# Patient Record
Sex: Male | Born: 1995 | Race: White | Hispanic: No | Marital: Single | Smoking: Former smoker
Health system: Southern US, Community
[De-identification: ages and names within clinical notes are randomized; demographics above are authoritative.]

## PROBLEM LIST (undated history)

## (undated) DIAGNOSIS — B009 Herpesviral infection, unspecified: Secondary | ICD-10-CM

---
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
## 2018-03-28 ENCOUNTER — Other Ambulatory Visit: Payer: Self-pay

## 2018-03-28 ENCOUNTER — Emergency Department (HOSPITAL_COMMUNITY)
Admission: EM | Admit: 2018-03-28 | Discharge: 2018-03-28 | Disposition: A | Discharge status: Home / Self Care | Payer: No Typology Code available for payment source | Attending: Emergency Medicine | Admitting: Emergency Medicine

## 2018-03-28 ENCOUNTER — Encounter (HOSPITAL_COMMUNITY): Payer: Self-pay | Admitting: Emergency Medicine

## 2018-03-28 DIAGNOSIS — R748 Abnormal levels of other serum enzymes: Secondary | ICD-10-CM

## 2018-03-28 DIAGNOSIS — R197 Diarrhea, unspecified: Secondary | ICD-10-CM | POA: Diagnosis not present

## 2018-03-28 DIAGNOSIS — R112 Nausea with vomiting, unspecified: Secondary | ICD-10-CM

## 2018-03-28 DIAGNOSIS — R739 Hyperglycemia, unspecified: Secondary | ICD-10-CM

## 2018-03-28 DIAGNOSIS — R945 Abnormal results of liver function studies: Secondary | ICD-10-CM | POA: Insufficient documentation

## 2018-03-28 DIAGNOSIS — D72829 Elevated white blood cell count, unspecified: Secondary | ICD-10-CM | POA: Diagnosis not present

## 2018-03-28 LAB — URINALYSIS, ROUTINE W REFLEX MICROSCOPIC
Bilirubin Urine: NEGATIVE
Glucose, UA: NEGATIVE mg/dL
Hgb urine dipstick: NEGATIVE
Ketones, ur: NEGATIVE mg/dL
Leukocytes, UA: NEGATIVE
Nitrite: NEGATIVE
Protein, ur: NEGATIVE mg/dL
Specific Gravity, Urine: 1.021 (ref 1.005–1.030)
pH: 5 (ref 5.0–8.0)

## 2018-03-28 LAB — INFLUENZA PANEL BY PCR (TYPE A & B)
Influenza A By PCR: NEGATIVE
Influenza B By PCR: NEGATIVE

## 2018-03-28 LAB — CBC
HCT: 47.9 % (ref 39.0–52.0)
Hemoglobin: 15.8 g/dL (ref 13.0–17.0)
MCH: 28.7 pg (ref 26.0–34.0)
MCHC: 33 g/dL (ref 30.0–36.0)
MCV: 86.9 fL (ref 80.0–100.0)
Platelets: 434 10*3/uL — ABNORMAL HIGH (ref 150–400)
RBC: 5.51 MIL/uL (ref 4.22–5.81)
RDW: 11.9 % (ref 11.5–15.5)
WBC: 19.8 10*3/uL — ABNORMAL HIGH (ref 4.0–10.5)
nRBC: 0 % (ref 0.0–0.2)

## 2018-03-28 LAB — COMPREHENSIVE METABOLIC PANEL
ALT: 98 U/L — ABNORMAL HIGH (ref 0–44)
AST: 57 U/L — ABNORMAL HIGH (ref 15–41)
Albumin: 4.7 g/dL (ref 3.5–5.0)
Alkaline Phosphatase: 74 U/L (ref 38–126)
Anion gap: 13 (ref 5–15)
BUN: 15 mg/dL (ref 6–20)
CO2: 22 mmol/L (ref 22–32)
Calcium: 10.4 mg/dL — ABNORMAL HIGH (ref 8.9–10.3)
Chloride: 104 mmol/L (ref 98–111)
Creatinine, Ser: 1.1 mg/dL (ref 0.61–1.24)
GFR calc Af Amer: >60 mL/min (ref >60)
GFR calc non Af Amer: >60 mL/min (ref >60)
Glucose, Bld: 154 mg/dL — ABNORMAL HIGH (ref 70–99)
Potassium: 3.7 mmol/L (ref 3.5–5.1)
Sodium: 139 mmol/L (ref 135–145)
Total Bilirubin: 0.6 mg/dL (ref 0.3–1.2)
Total Protein: 8.7 g/dL — ABNORMAL HIGH (ref 6.5–8.1)

## 2018-03-28 LAB — LIPASE, BLOOD: Lipase: 25 U/L (ref 11–51)

## 2018-03-28 MED ORDER — SODIUM CHLORIDE 0.9 % IV BOLUS
1000.0000 mL | Freq: Once | INTRAVENOUS | Status: AC
  Administered 2018-03-28: 1000 mL via INTRAVENOUS

## 2018-03-28 MED ORDER — ONDANSETRON 4 MG PO TBDP
4.0000 mg | ORAL_TABLET | Freq: Once | ORAL | Status: AC | PRN
  Administered 2018-03-28: 4 mg via ORAL
  Filled 2018-03-28: qty 1

## 2018-03-28 MED ORDER — PROMETHAZINE HCL 25 MG PO TABS: 25.0000 mg | ORAL_TABLET | Freq: Four times a day (QID) | ORAL | 0 refills | Status: DC | PRN

## 2018-03-28 MED ORDER — HALOPERIDOL LACTATE 5 MG/ML IJ SOLN
2.0000 mg | Freq: Once | INTRAMUSCULAR | Status: AC
  Administered 2018-03-28: 2 mg via INTRAVENOUS
  Filled 2018-03-28: qty 1

## 2018-03-28 MED ORDER — DICYCLOMINE HCL 10 MG PO CAPS
20.0000 mg | ORAL_CAPSULE | Freq: Once | ORAL | Status: AC
  Administered 2018-03-28: 20 mg via ORAL
  Filled 2018-03-28: qty 2

## 2018-03-28 MED ORDER — SODIUM CHLORIDE 0.9% FLUSH: 3.0000 mL | Freq: Once | INTRAVENOUS | Status: DC

## 2018-03-28 MED ORDER — METOCLOPRAMIDE HCL 5 MG/ML IJ SOLN
10.0000 mg | Freq: Once | INTRAMUSCULAR | Status: AC
  Administered 2018-03-28: 10 mg via INTRAVENOUS
  Filled 2018-03-28: qty 2

## 2018-03-28 MED ORDER — DICYCLOMINE HCL 20 MG PO TABS: 20.0000 mg | ORAL_TABLET | Freq: Two times a day (BID) | ORAL | 0 refills | Status: DC

## 2018-03-28 NOTE — ED Notes (Signed)
  Patient has tolerated water and ginger ale.  Patient states he feels better and has had no nausea or vomiting since the haldol.

## 2018-03-28 NOTE — Discharge Instructions (Addendum)
The nausea tablet I have prescribed (phenergan) can be taken both by mouth, or placed in your rectum as a suppository if you are unable to hold the medication down by mouth. Continue to take frequent small sips of fluid every 10 minutes. You may use over the counter medications for diarrhea such as kaopectate, Pepto bismol, or imodium.   Contact a health care provider if: You cannot keep fluids down. Your symptoms get worse. You have new symptoms. You feel light-headed or dizzy. You have muscle cramps. Get help right away if: You have chest pain. You feel extremely weak or you faint. You see blood in your vomit. Your vomit looks like coffee grounds. You have bloody or black stools or stools that look like tar. You have a severe headache, a stiff neck, or both. You have a rash. You have severe pain, cramping, or bloating in your abdomen. You have trouble breathing or you are breathing very quickly. Your heart is beating very quickly. Your skin feels cold and clammy. You feel confused. You have pain when you urinate. You have signs of dehydration, such as: Dark urine, very little urine, or no urine. Cracked lips. Dry mouth. Sunken eyes. Sleepiness. Weakness.

## 2018-03-28 NOTE — ED Triage Notes (Signed)
C/o sharp generalized abd pain, nausea, vomiting, and diarrhea x 3-4 hours.

## 2018-03-28 NOTE — ED Provider Notes (Signed)
[facility] Provider Note   CSN: [id number] Arrival date & time: 03/28/18  0023     History   Chief Complaint Chief Complaint  Patient presents with  . Abdominal Pain  . Vomiting  . Diarrhea    HPI Spencer Koch is a 23 y.o. male who presents with cc of N/V/D. Hx is given by the patient and his roommates who are at bedside. The patient had sudden onset of severe intractable vomiting and diarrhea.  The patient has had greater than 15 episodes of nonbloody nonbilious vomitus and at least 10 episodes of brown watery diarrhea.  He complains of generalized, crampy abdominal pain without focal tenderness.  He denies fever or chills.  No one in the house has had the same symptoms.  He denies any recent foreign travel.  He is unsure if he has ingested any suspicious foods.  HPI  History reviewed. No pertinent past medical history.  There are no active problems to display for this patient.   History reviewed. No pertinent surgical history.      Home Medications    Prior to Admission medications   Not on File    Family History No family history on file.  Social History Social History   Tobacco Use  . Smoking status: Never Smoker  . Smokeless tobacco: Never Used  Substance Use Topics  . Alcohol use: Not Currently  . Drug use: Not Currently     Allergies   Patient has no allergy information on record.   Review of Systems Review of Systems Ten systems reviewed and are negative for acute change, except as noted in the HPI.   Physical Exam Updated Vital Signs BP (!) 115/56   Pulse 83   Temp 97.8 F (36.6 C) (Oral)   Resp 18   Ht 5\' 6"  (1.676 m)   Wt 81.6 kg   SpO2 98%   BMI 29.05 kg/m   Physical Exam Vitals signs and nursing note reviewed.  Constitutional:      General: He is not in acute distress.    Appearance: He is well-developed. He is ill-appearing. He is not diaphoretic.     Comments: Patient actively  vomiting and retching  HENT:     Head: Normocephalic and atraumatic.     Comments: Face and upper chest are covered in petechial hemorrhage    Mouth/Throat:     Mouth: Mucous membranes are dry.  Eyes:     General: No scleral icterus.    Conjunctiva/sclera: Conjunctivae normal.  Neck:     Musculoskeletal: Normal range of motion and neck supple.  Cardiovascular:     Rate and Rhythm: Normal rate and regular rhythm.     Heart sounds: Normal heart sounds.  Pulmonary:     Effort: Pulmonary effort is normal. No respiratory distress.     Breath sounds: Normal breath sounds.  Abdominal:     Palpations: Abdomen is soft.     Tenderness: There is no abdominal tenderness.  Skin:    General: Skin is warm and dry.  Neurological:     Mental Status: He is alert.  Psychiatric:        Behavior: Behavior normal.      ED Treatments / Results  Labs (all labs ordered are listed, but only abnormal results are displayed) Labs Reviewed  COMPREHENSIVE METABOLIC PANEL - Abnormal; Notable for the following components:      Result Value   Glucose, Bld 154 (*)  Calcium 10.4 (*)    Total Protein 8.7 (*)    AST 57 (*)    ALT 98 (*)    All other components within normal limits  CBC - Abnormal; Notable for the following components:   WBC 19.8 (*)    Platelets 434 (*)    All other components within normal limits  LIPASE, BLOOD  INFLUENZA PANEL BY PCR (TYPE A & B)  URINALYSIS, ROUTINE W REFLEX MICROSCOPIC    EKG None  Radiology No results found.  Procedures Procedures (including critical care time)  Medications Ordered in ED Medications  sodium chloride flush (NS) 0.9 % injection 3 mL (has no administration in time range)  dicyclomine (BENTYL) capsule 20 mg (has no administration in time range)  haloperidol lactate (HALDOL) injection 2 mg (has no administration in time range)  ondansetron (ZOFRAN-ODT) disintegrating tablet 4 mg (4 mg Oral Given 03/28/18 0049)  sodium chloride 0.9 %  bolus 1,000 mL (0 mLs Intravenous Stopped 03/28/18 0241)  metoCLOPramide (REGLAN) injection 10 mg (10 mg Intravenous Given 03/28/18 0137)     Initial Impression / Assessment and Plan / ED Course  I have reviewed the triage vital signs and the nursing notes.  Pertinent labs & imaging results that were available during my care of the patient were reviewed by me and considered in my medical decision making (see chart for details).  Clinical Course as of Mar 28 346  Thu Mar 28, 2018  0346 Patient vomited his fluids. Reassessment of abdomen remains benign without focal tenderness, rebound or guarding. Patient does admit to daily marijuana use. ECG performed and shows NSR without QT prolongation. Will give Haldol.   [AH]    Clinical Course User Index [AH] Margarita Mail, PA-C     23 year old male with sudden onset nausea vomiting and diarrhea.  Patient labs reviewed and shows leukocytosis and elevated liver enzymes.  His blood glucose level is also elevated which I suspect is all of acute phase reaction.  Patient vomiting resolved after administration of Haldol.  He has been able to tolerate p.o. fluids.  He has a benign abdominal exam and I have no suspicion for intra-abdominal infection.  Patient has had stable vital signs.  Tolerating p.o. fluids and appears appropriate for discharge at this time. Final Clinical Impressions(s) / ED Diagnoses   Final diagnoses:  Nausea vomiting and diarrhea  Elevated liver enzymes  Leukocytosis, unspecified type  Hyperglycemia    ED Discharge Orders    None       Margarita Mail, PA-C 03/28/18 3662    Fatima Blank, MD 03/28/18 [phone]

## 2018-09-23 ENCOUNTER — Ambulatory Visit: Payer: No Typology Code available for payment source | Admitting: Family Medicine

## 2018-09-23 DIAGNOSIS — Z0289 Encounter for other administrative examinations: Secondary | ICD-10-CM

## 2018-10-28 ENCOUNTER — Other Ambulatory Visit: Payer: Self-pay

## 2018-10-28 DIAGNOSIS — Z20822 Contact with and (suspected) exposure to covid-19: Secondary | ICD-10-CM

## 2018-10-30 LAB — NOVEL CORONAVIRUS, NAA: SARS-CoV-2, NAA: NOT DETECTED

## 2018-12-10 ENCOUNTER — Other Ambulatory Visit: Payer: Self-pay

## 2018-12-10 DIAGNOSIS — Z20822 Contact with and (suspected) exposure to covid-19: Secondary | ICD-10-CM

## 2018-12-11 LAB — NOVEL CORONAVIRUS, NAA: SARS-CoV-2, NAA: NOT DETECTED

## 2019-03-17 ENCOUNTER — Encounter: Payer: Self-pay | Admitting: Family Medicine

## 2019-03-17 ENCOUNTER — Other Ambulatory Visit (HOSPITAL_COMMUNITY)
Admission: RE | Admit: 2019-03-17 | Discharge: 2019-03-17 | Disposition: A | Discharge status: Home / Self Care | Payer: No Typology Code available for payment source | Source: Ambulatory Visit | Attending: Family Medicine | Admitting: Family Medicine

## 2019-03-17 ENCOUNTER — Other Ambulatory Visit: Payer: Self-pay

## 2019-03-17 ENCOUNTER — Ambulatory Visit (INDEPENDENT_AMBULATORY_CARE_PROVIDER_SITE_OTHER): Payer: No Typology Code available for payment source | Admitting: Family Medicine

## 2019-03-17 VITALS — BP 116/60 | HR 68 | Temp 98.7°F | Ht 66.0 in | Wt 185.2 lb

## 2019-03-17 DIAGNOSIS — R300 Dysuria: Secondary | ICD-10-CM | POA: Insufficient documentation

## 2019-03-17 DIAGNOSIS — H6123 Impacted cerumen, bilateral: Secondary | ICD-10-CM | POA: Insufficient documentation

## 2019-03-17 LAB — URINALYSIS, ROUTINE W REFLEX MICROSCOPIC
Bilirubin Urine: NEGATIVE
Hgb urine dipstick: NEGATIVE
Ketones, ur: NEGATIVE
Leukocytes,Ua: NEGATIVE
Nitrite: NEGATIVE
RBC / HPF: NONE SEEN (ref 0–?)
Specific Gravity, Urine: 1.02 (ref 1.000–1.030)
Total Protein, Urine: NEGATIVE
Urine Glucose: NEGATIVE
Urobilinogen, UA: 0.2 (ref 0.0–1.0)
pH: 7.5 (ref 5.0–8.0)

## 2019-03-17 MED ORDER — EAR WAX CLEANSING 6.5 % OT KIT: PACK | OTIC | 0 refills | Status: DC

## 2019-03-17 MED ORDER — DOXYCYCLINE HYCLATE 100 MG PO TABS: 100.0000 mg | ORAL_TABLET | Freq: Two times a day (BID) | ORAL | 0 refills | Status: DC

## 2019-03-17 NOTE — Progress Notes (Signed)
New Patient Office Visit  Subjective:  Patient ID: Spencer Koch, male    DOB: 1996/02/09  Age: 24 y.o. MRN: [id number]  CC:  Chief Complaint  Patient presents with  . Establish Care    personal concerns     HPI Spencer Koch presents for establishment of care and follow-up for abnormal urine screening results.  Patient, himself, had blood work and urine tested through [facility].  Testing included normal CBC, CMP.  Urinalysis was positive for leukocyte esterase and 6-10 WBCs per high-powered field.  Screening for trichomonas and Gardnerella were negative.  He tested negative for type I and type II herpes simplex.  Tested negative for hepatitis A, B and C.  He was not tested for HIV GC or chlamydia.  Last sexual activity was back in October.  He does believe he has had some mild dysuria but denies decrease in the force of his stream nocturia or discharge.  He lives with his roommate.  He works in [employer]  History reviewed. No pertinent past medical history.  History reviewed. No pertinent surgical history.  History reviewed. No pertinent family history.  Social History   Socioeconomic History  . Marital status: Single    Spouse name: Not on file  . Number of children: Not on file  . Years of education: Not on file  . Highest education level: Not on file  Occupational History  . Not on file  Tobacco Use  . Smoking status: Current Every Day Smoker    Packs/day: 0.25    Types: Cigarettes  . Smokeless tobacco: Never Used  Substance and Sexual Activity  . Alcohol use: Yes    Alcohol/week: 5.0 - 9.0 standard drinks    Types: 1 - 5 Cans of beer, 2 Shots of liquor, 2 Standard drinks or equivalent per week  . Drug use: Not Currently    Types: Marijuana    Comment: social  . Sexual activity: Yes  Other Topics Concern  . Not on file  Social History Narrative  . Not on file   Social Determinants of Health   Financial Resource Strain:   .  Difficulty of Paying Living Expenses: Not on file  Food Insecurity:   . Worried About Charity fundraiser in the Last Year: Not on file  . Ran Out of Food in the Last Year: Not on file  Transportation Needs:   . Lack of Transportation (Medical): Not on file  . Lack of Transportation (Non-Medical): Not on file  Physical Activity:   . Days of Exercise per Week: Not on file  . Minutes of Exercise per Session: Not on file  Stress:   . Feeling of Stress : Not on file  Social Connections:   . Frequency of Communication with Friends and Family: Not on file  . Frequency of Social Gatherings with Friends and Family: Not on file  . Attends Religious Services: Not on file  . Active Member of Clubs or Organizations: Not on file  . Attends Archivist Meetings: Not on file  . Marital Status: Not on file  Intimate Partner Violence:   . Fear of Current or Ex-Partner: Not on file  . Emotionally Abused: Not on file  . Physically Abused: Not on file  . Sexually Abused: Not on file    ROS Review of Systems  Constitutional: Negative.   Respiratory: Negative.   Cardiovascular: Negative.   Gastrointestinal: Negative.   Endocrine: Negative for polyphagia and polyuria.  Genitourinary: Positive for dysuria. Negative for difficulty urinating, discharge, frequency, genital sores and urgency.  Musculoskeletal: Negative for arthralgias and myalgias.  Skin: Negative for pallor and rash.  Allergic/Immunologic: Negative for immunocompromised state.  Neurological: Negative for light-headedness and numbness.  Hematological: Does not bruise/bleed easily.  Psychiatric/Behavioral: Negative.     Objective:   Today's Vitals: BP 116/60   Pulse 68   Temp 98.7 F (37.1 C) (Oral)   Ht 5' 6" (1.676 m)   Wt 185 lb 3.2 oz (84 kg)   SpO2 98%   BMI 29.89 kg/m   Physical Exam Vitals and nursing note reviewed.  Constitutional:      General: He is not in acute distress.    Appearance: Normal  appearance. He is normal weight. He is not ill-appearing, toxic-appearing or diaphoretic.  HENT:     Head: Normocephalic and atraumatic.     Right Ear: External ear normal. There is impacted cerumen.     Left Ear: External ear normal. There is impacted cerumen.     Nose: No congestion or rhinorrhea.     Mouth/Throat:     Mouth: Mucous membranes are moist.     Pharynx: Oropharynx is clear. No oropharyngeal exudate or posterior oropharyngeal erythema.  Eyes:     General: No scleral icterus.       Right eye: No discharge.        Left eye: No discharge.     Extraocular Movements: Extraocular movements intact.     Conjunctiva/sclera: Conjunctivae normal.     Pupils: Pupils are equal, round, and reactive to light.  Neck:     Vascular: No carotid bruit.  Cardiovascular:     Rate and Rhythm: Normal rate and regular rhythm.  Pulmonary:     Effort: Pulmonary effort is normal.     Breath sounds: Normal breath sounds.  Abdominal:     General: Bowel sounds are normal.     Hernia: There is no hernia in the left inguinal area or right inguinal area.  Genitourinary:    Penis: Circumcised. No hypospadias, erythema, tenderness, discharge, swelling or lesions.      Testes: Normal.        Right: Mass, tenderness or swelling not present. Right testis is descended.        Left: Mass or swelling not present. Left testis is descended.     Epididymis:     Right: Not inflamed or enlarged. No mass.     Left: Not inflamed. No mass.  Musculoskeletal:     Cervical back: No rigidity or tenderness.  Lymphadenopathy:     Cervical: No cervical adenopathy.     Lower Body: No right inguinal adenopathy. No left inguinal adenopathy.  Neurological:     Mental Status: He is alert and oriented to person, place, and time.  Psychiatric:        Mood and Affect: Mood normal.        Behavior: Behavior normal.     Assessment & Plan:   Problem List Items Addressed This Visit      Nervous and Auditory    Ceruminosis, bilateral   Relevant Medications   Carbamide Peroxide-Saline (EAR WAX CLEANSING) 6.5 % KIT     Other   Dysuria - Primary   Relevant Medications   doxycycline (VIBRA-TABS) 100 MG tablet   Other Relevant Orders   HIV antibody (with reflex)   Urine cytology ancillary only([place])   Urinalysis, Routine w reflex microscopic   Urine Culture  Outpatient Encounter Medications as of 03/17/2019  Medication Sig  . Carbamide Peroxide-Saline (EAR WAX CLEANSING) 6.5 % KIT Follow directions on kit.  Marland Kitchen dicyclomine (BENTYL) 20 MG tablet Take 1 tablet (20 mg total) by mouth 2 (two) times daily. (Patient not taking: Reported on 03/17/2019)  . doxycycline (VIBRA-TABS) 100 MG tablet Take 1 tablet (100 mg total) by mouth 2 (two) times daily.  . promethazine (PHENERGAN) 25 MG tablet Take 1 tablet (25 mg total) by mouth every 6 (six) hours as needed for nausea or vomiting. (Patient not taking: Reported on 03/17/2019)   No facility-administered encounter medications on file as of 03/17/2019.    Follow-up: Return in about 3 months (around 06/15/2019), or if symptoms worsen or fail to improve, for Return for physical. .   Patient was given information on doxycycline and dysuria.  Libby Maw, MD

## 2019-03-17 NOTE — Patient Instructions (Signed)
Dysuria Dysuria is pain or discomfort while urinating. The pain or discomfort may be felt in the part of your body that drains urine from the bladder (urethra) or in the surrounding tissue of the genitals. The pain may also be felt in the groin area, lower abdomen, or lower back. You may have to urinate frequently or have the sudden feeling that you have to urinate (urgency). Dysuria can affect both men and women, but it is more common in women. Dysuria can be caused by many different things, including:  Urinary tract infection.  Kidney stones or bladder stones.  Certain sexually transmitted infections (STIs), such as chlamydia.  Dehydration.  Inflammation of the tissues of the vagina.  Use of certain medicines.  Use of certain soaps or scented products that cause irritation. Follow these instructions at home: General instructions  Watch your condition for any changes.  Urinate often. Avoid holding urine for long periods of time.  After a bowel movement or urination, women should cleanse from front to back, using each tissue only once.  Urinate after sexual intercourse.  Keep all follow-up visits as told by your health care provider. This is important.  If you had any tests done to find the cause of dysuria, it is up to you to get your test results. Ask your health care provider, or the department that is doing the test, when your results will be ready. Eating and drinking   Drink enough fluid to keep your urine pale yellow.  Avoid caffeine, tea, and alcohol. They can irritate the bladder and make dysuria worse. In men, alcohol may irritate the prostate. Medicines  Take over-the-counter and prescription medicines only as told by your health care provider.  If you were prescribed an antibiotic medicine, take it as told by your health care provider. Do not stop taking the antibiotic even if you start to feel better. Contact a health care provider if:  You have a  fever.  You develop pain in your back or sides.  You have nausea or vomiting.  You have blood in your urine.  You are not urinating as often as you usually do. Get help right away if:  Your pain is severe and not relieved with medicines.  You cannot eat or drink without vomiting.  You are confused.  You have a rapid heartbeat while at rest.  You have shaking or chills.  You feel extremely weak. Summary  Dysuria is pain or discomfort while urinating. Many different conditions can lead to dysuria.  If you have dysuria, you may have to urinate frequently or have the sudden feeling that you have to urinate (urgency).  Watch your condition for any changes. Keep all follow-up visits as told by your health care provider.  Make sure that you urinate often and drink enough fluid to keep your urine pale yellow. This information is not intended to replace advice given to you by your health care provider. Make sure you discuss any questions you have with your health care provider. Document Revised: 02/09/2017 Document Reviewed: 12/14/2016 Elsevier Patient Education  [place]. Doxycycline tablets or capsules What is this medicine? DOXYCYCLINE (dox i SYE kleen) is a tetracycline antibiotic. It kills certain bacteria or stops their growth. It is used to treat many kinds of infections, like dental, skin, respiratory, and urinary tract infections. It also treats acne, Lyme disease, malaria, and certain sexually transmitted infections. This medicine may be used for other purposes; ask your health care provider or pharmacist if  you have questions. COMMON BRAND NAME(S): Acticlate, Adoxa, Adoxa CK, Adoxa Pak, Adoxa TT, Alodox, Avidoxy, Doxal, LYMEPAK, Mondoxyne NL, Monodox, Morgidox 1x, Morgidox 1x Kit, Morgidox 2x, Morgidox 2x Kit, NutriDox, Ocudox, [place], [place], Vibra-Tabs, Vibramycin What should I tell my health care provider before I take this medicine? They need to know if you  have any of these conditions:  liver disease  long exposure to sunlight like working outdoors  stomach problems like colitis  an unusual or allergic reaction to doxycycline, tetracycline antibiotics, other medicines, foods, dyes, or preservatives  pregnant or trying to get pregnant  breast-feeding How should I use this medicine? Take this medicine by mouth with a full glass of water. Follow the directions on the prescription label. It is best to take this medicine without food, but if it upsets your stomach take it with food. Take your medicine at regular intervals. Do not take your medicine more often than directed. Take all of your medicine as directed even if you think you are better. Do not skip doses or stop your medicine early. Talk to your pediatrician regarding the use of this medicine in children. While this drug may be prescribed for selected conditions, precautions do apply. Overdosage: If you think you have taken too much of this medicine contact a poison control center or emergency room at once. NOTE: This medicine is only for you. Do not share this medicine with others. What if I miss a dose? If you miss a dose, take it as soon as you can. If it is almost time for your next dose, take only that dose. Do not take double or extra doses. What may interact with this medicine?  antacids  barbiturates  birth control pills  bismuth subsalicylate  carbamazepine  methoxyflurane  other antibiotics  phenytoin  vitamins that contain iron  warfarin This list may not describe all possible interactions. Give your health care provider a list of all the medicines, herbs, non-prescription drugs, or dietary supplements you use. Also tell them if you smoke, drink alcohol, or use illegal drugs. Some items may interact with your medicine. What should I watch for while using this medicine? Tell your doctor or health care professional if your symptoms do not improve. Do not treat  diarrhea with over the counter products. Contact your doctor if you have diarrhea that lasts more than 2 days or if it is severe and watery. Do not take this medicine just before going to bed. It may not dissolve properly when you lay down and can cause pain in your throat. Drink plenty of fluids while taking this medicine to also help reduce irritation in your throat. This medicine can make you more sensitive to the sun. Keep out of the sun. If you cannot avoid being in the sun, wear protective clothing and use sunscreen. Do not use sun lamps or tanning beds/booths. Birth control pills may not work properly while you are taking this medicine. Talk to your doctor about using an extra method of birth control. If you are being treated for a sexually transmitted infection, avoid sexual contact until you have finished your treatment. Your sexual partner may also need treatment. Avoid antacids, aluminum, calcium, magnesium, and iron products for 4 hours before and 2 hours after taking a dose of this medicine. If you are using this medicine to prevent malaria, you should still protect yourself from contact with mosquitos. Stay in screened-in areas, use mosquito nets, keep your body covered, and use an insect repellent. What  side effects may I notice from receiving this medicine? Side effects that you should report to your doctor or health care professional as soon as possible:  allergic reactions like skin rash, itching or hives, swelling of the face, lips, or tongue  difficulty breathing  fever  itching in the rectal or genital area  pain on swallowing  rash, fever, and swollen lymph nodes  redness, blistering, peeling or loosening of the skin, including inside the mouth  severe stomach pain or cramps  unusual bleeding or bruising  unusually weak or tired  yellowing of the eyes or skin Side effects that usually do not require medical attention (report to your doctor or health care  professional if they continue or are bothersome):  diarrhea  loss of appetite  nausea, vomiting This list may not describe all possible side effects. Call your doctor for medical advice about side effects. You may report side effects to FDA at 1-800-FDA-1088. Where should I keep my medicine? Keep out of the reach of children. Store at room temperature, below 30 degrees C (86 degrees F). Protect from light. Keep container tightly closed. Throw away any unused medicine after the expiration date. Taking this medicine after the expiration date can make you seriously ill. NOTE: This sheet is a summary. It may not cover all possible information. If you have questions about this medicine, talk to your doctor, pharmacist, or health care provider.  2020 Elsevier/Gold Standard (2018-05-30 13:44:53)

## 2019-03-18 LAB — URINE CULTURE
MICRO NUMBER:: 10003891
Result:: NO GROWTH
SPECIMEN QUALITY:: ADEQUATE

## 2019-03-18 LAB — URINE CYTOLOGY ANCILLARY ONLY
Chlamydia: NEGATIVE
Comment: NEGATIVE
Comment: NORMAL
Neisseria Gonorrhea: NEGATIVE

## 2019-03-18 LAB — HIV ANTIBODY (ROUTINE TESTING W REFLEX): HIV 1&2 Ab, 4th Generation: NONREACTIVE

## 2019-06-19 ENCOUNTER — Ambulatory Visit: Payer: No Typology Code available for payment source | Admitting: Family Medicine

## 2019-07-01 ENCOUNTER — Ambulatory Visit (INDEPENDENT_AMBULATORY_CARE_PROVIDER_SITE_OTHER): Payer: No Typology Code available for payment source | Admitting: Family Medicine

## 2019-07-01 ENCOUNTER — Encounter: Payer: Self-pay | Admitting: Family Medicine

## 2019-07-01 ENCOUNTER — Other Ambulatory Visit: Payer: Self-pay

## 2019-07-01 VITALS — BP 110/72 | HR 68 | Temp 98.0°F | Ht 66.0 in | Wt 189.4 lb

## 2019-07-01 DIAGNOSIS — A6001 Herpesviral infection of penis: Secondary | ICD-10-CM | POA: Insufficient documentation

## 2019-07-01 DIAGNOSIS — D229 Melanocytic nevi, unspecified: Secondary | ICD-10-CM | POA: Diagnosis not present

## 2019-07-01 DIAGNOSIS — L910 Hypertrophic scar: Secondary | ICD-10-CM | POA: Diagnosis not present

## 2019-07-01 MED ORDER — FAMCICLOVIR 250 MG PO TABS: 250.0000 mg | ORAL_TABLET | Freq: Three times a day (TID) | ORAL | 0 refills | Status: AC

## 2019-07-01 MED ORDER — FAMCICLOVIR 250 MG PO TABS: 250.0000 mg | ORAL_TABLET | Freq: Three times a day (TID) | ORAL | 0 refills | Status: DC

## 2019-07-01 NOTE — Progress Notes (Signed)
Established Patient Office Visit  Subjective:  Patient ID: Spencer B Heinecke, male    DOB: 23-Oct-1995  Age: 24 y.o. MRN: [id number]  CC:  Chief Complaint  Patient presents with  . Follow-up    3 month follow up, pt has personal concerns and would also like referral to dermatologist for growth on chest x many years.     HPI Spencer Koch presents for evaluation treatment of the eruption of mildly uncomfortable rash on the shaft of his penis.  He does have a new sexual partner.  There is no dysuria discharge or frequency of urination.  He has no prior history of herpes.  He also has multiple moles that he is concerned about a large lesion in his missed mid chest area.  History reviewed. No pertinent past medical history.  History reviewed. No pertinent surgical history.  History reviewed. No pertinent family history.  Social History   Socioeconomic History  . Marital status: Single    Spouse name: Not on file  . Number of children: Not on file  . Years of education: Not on file  . Highest education level: Not on file  Occupational History  . Not on file  Tobacco Use  . Smoking status: Current Every Day Smoker    Packs/day: 0.25    Types: Cigarettes  . Smokeless tobacco: Never Used  Substance and Sexual Activity  . Alcohol use: Yes    Alcohol/week: 5.0 - 9.0 standard drinks    Types: 1 - 5 Cans of beer, 2 Shots of liquor, 2 Standard drinks or equivalent per week  . Drug use: Not Currently    Types: Marijuana    Comment: social  . Sexual activity: Yes  Other Topics Concern  . Not on file  Social History Narrative  . Not on file   Social Determinants of Health   Financial Resource Strain:   . Difficulty of Paying Living Expenses:   Food Insecurity:   . Worried About Charity fundraiser in the Last Year:   . Arboriculturist in the Last Year:   Transportation Needs:   . Film/video editor (Medical):   Marland Kitchen Lack of Transportation (Non-Medical):   Physical  Activity:   . Days of Exercise per Week:   . Minutes of Exercise per Session:   Stress:   . Feeling of Stress :   Social Connections:   . Frequency of Communication with Friends and Family:   . Frequency of Social Gatherings with Friends and Family:   . Attends Religious Services:   . Active Member of Clubs or Organizations:   . Attends Archivist Meetings:   Marland Kitchen Marital Status:   Intimate Partner Violence:   . Fear of Current or Ex-Partner:   . Emotionally Abused:   Marland Kitchen Physically Abused:   . Sexually Abused:     Outpatient Medications Prior to Visit  Medication Sig Dispense Refill  . Carbamide Peroxide-Saline (EAR WAX CLEANSING) 6.5 % KIT Follow directions on kit. (Patient not taking: Reported on 07/01/2019) 1 kit 0  . dicyclomine (BENTYL) 20 MG tablet Take 1 tablet (20 mg total) by mouth 2 (two) times daily. (Patient not taking: Reported on 03/17/2019) 20 tablet 0  . doxycycline (VIBRA-TABS) 100 MG tablet Take 1 tablet (100 mg total) by mouth 2 (two) times daily. (Patient not taking: Reported on 07/01/2019) 20 tablet 0  . promethazine (PHENERGAN) 25 MG tablet Take 1 tablet (25 mg total) by mouth every 6 (  six) hours as needed for nausea or vomiting. (Patient not taking: Reported on 03/17/2019) 10 tablet 0   No facility-administered medications prior to visit.    No Known Allergies  ROS Review of Systems  Constitutional: Negative.   HENT: Negative.   Eyes: Negative for photophobia and visual disturbance.  Respiratory: Negative.   Cardiovascular: Negative.   Gastrointestinal: Negative.   Genitourinary: Positive for genital sores. Negative for difficulty urinating, discharge, frequency, hematuria, penile pain, penile swelling and testicular pain.  Musculoskeletal: Negative for arthralgias and myalgias.  Skin: Positive for color change and rash.  Psychiatric/Behavioral: Negative.       Objective:    Physical Exam  Constitutional: He is oriented to person, place, and  time. He appears well-developed and well-nourished. No distress.  HENT:  Head: Normocephalic and atraumatic.  Right Ear: External ear normal.  Left Ear: External ear normal.  Eyes: Conjunctivae are normal. Right eye exhibits no discharge. Left eye exhibits no discharge. No scleral icterus.  Neck: No JVD present. No tracheal deviation present.  Pulmonary/Chest: Effort normal. No stridor.  Neurological: He is alert and oriented to person, place, and time.  Skin: He is not diaphoretic.     Psychiatric: He has a normal mood and affect. His behavior is normal.    BP 110/72   Pulse 68   Temp 98 F (36.7 C) (Tympanic)   Ht 5' 6" (1.676 m)   Wt 189 lb 6.4 oz (85.9 kg)   SpO2 96%   BMI 30.57 kg/m  Wt Readings from Last 3 Encounters:  07/01/19 189 lb 6.4 oz (85.9 kg)  03/17/19 185 lb 3.2 oz (84 kg)  03/28/18 180 lb (81.6 kg)     Health Maintenance Due  Topic Date Due  . COVID-19 Vaccine (1) Never done  . TETANUS/TDAP  Never done    There are no preventive care reminders to display for this patient.  No results found for: TSH Lab Results  Component Value Date   WBC 19.8 (H) 03/28/2018   HGB 15.8 03/28/2018   HCT 47.9 03/28/2018   MCV 86.9 03/28/2018   PLT 434 (H) 03/28/2018   Lab Results  Component Value Date   NA 139 03/28/2018   K 3.7 03/28/2018   CO2 22 03/28/2018   GLUCOSE 154 (H) 03/28/2018   BUN 15 03/28/2018   CREATININE 1.10 03/28/2018   BILITOT 0.6 03/28/2018   ALKPHOS 74 03/28/2018   AST 57 (H) 03/28/2018   ALT 98 (H) 03/28/2018   PROT 8.7 (H) 03/28/2018   ALBUMIN 4.7 03/28/2018   CALCIUM 10.4 (H) 03/28/2018   ANIONGAP 13 03/28/2018   No results found for: CHOL No results found for: HDL No results found for: LDLCALC No results found for: TRIG No results found for: CHOLHDL No results found for: HGBA1C    Assessment & Plan:   Problem List Items Addressed This Visit      Musculoskeletal and Integument   Keloid   Relevant Orders   Ambulatory  referral to Dermatology   Multiple atypical nevi   Relevant Orders   Ambulatory referral to Dermatology     Genitourinary   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 DISCONTD: famciclovir (FAMVIR) 250 MG tablet    Sig: Take 1 tablet (250  mg total) by mouth 3 (three) times daily for 7 days.    Dispense:  21 tablet    Refill:  0  . famciclovir (FAMVIR) 250 MG tablet    Sig: Take 1 tablet (250 mg total) by mouth 3 (three) times daily for 7 days.    Dispense:  21 tablet    Refill:  0    Follow-up: Return if symptoms worsen or fail to improve, for Please return for a physical. Liver enzymes have been elevated in thepast.    Libby Maw, MD

## 2019-07-01 NOTE — Patient Instructions (Signed)

## 2019-07-01 NOTE — Addendum Note (Signed)
Addended by: Lynnea Ferrier on: 07/01/2019 03:35 PM   Modules accepted: Orders

## 2019-07-03 ENCOUNTER — Telehealth: Payer: Self-pay | Admitting: Family Medicine

## 2019-07-03 NOTE — Telephone Encounter (Signed)
Helene Kelp with [facility] called Call back # [phone]  Please call with the "source" for the herpes simplex testing that was ordered.

## 2019-07-03 NOTE — Telephone Encounter (Signed)
[facility] lab needs source of HSV culture collected on 07/01/19. Their number is [phone] and their accession # is [id number] N.

## 2019-07-03 NOTE — Telephone Encounter (Signed)
Spoke with Helene Kelp who verbally understood source for herpes simplex test.

## 2019-07-04 LAB — TIQ- AMBIGUOUS ORDER

## 2019-07-04 LAB — HERPES SIMPLEX VIRUS CULTURE
MICRO NUMBER:: 10384292
SPECIMEN QUALITY:: ADEQUATE

## 2019-07-04 NOTE — Telephone Encounter (Signed)
Duplicate message. 

## 2019-08-04 ENCOUNTER — Telehealth: Payer: Self-pay | Admitting: Family Medicine

## 2019-08-04 DIAGNOSIS — A6001 Herpesviral infection of penis: Secondary | ICD-10-CM

## 2019-08-04 NOTE — Telephone Encounter (Signed)
Patient calling states that he has the same rash that he had on his penis a few weeks ago and would like to have another prescription of Famciclovir sent in tho the pharmacy. Symptoms x 1 week please advise, will patient need to come in?

## 2019-08-04 NOTE — Telephone Encounter (Signed)
Patient is calling and requesting a refill for famciclovir sen to [facility] in [place] on [address]. CB is [phone]

## 2019-08-05 MED ORDER — FAMCICLOVIR 250 MG PO TABS: 250.0000 mg | ORAL_TABLET | Freq: Two times a day (BID) | ORAL | 0 refills | Status: AC

## 2019-09-01 ENCOUNTER — Encounter: Payer: Self-pay | Admitting: Physician Assistant

## 2019-09-01 ENCOUNTER — Other Ambulatory Visit: Payer: Self-pay

## 2019-09-01 ENCOUNTER — Ambulatory Visit (INDEPENDENT_AMBULATORY_CARE_PROVIDER_SITE_OTHER): Payer: No Typology Code available for payment source | Admitting: Physician Assistant

## 2019-09-01 DIAGNOSIS — L910 Hypertrophic scar: Secondary | ICD-10-CM | POA: Diagnosis not present

## 2019-09-01 DIAGNOSIS — D229 Melanocytic nevi, unspecified: Secondary | ICD-10-CM

## 2019-09-01 DIAGNOSIS — D225 Melanocytic nevi of trunk: Secondary | ICD-10-CM

## 2019-09-01 MED ORDER — TRIAMCINOLONE ACETONIDE 10 MG/ML IJ SUSP
10.0000 mg | Freq: Once | INTRAMUSCULAR | Status: AC
  Administered 2019-09-01: 10 mg

## 2019-09-01 NOTE — Progress Notes (Signed)
   New Patient Visit  Subjective  Spencer Koch is a 24 y.o. male who presents for the following: Skin Problem (Has a keloid on chest. Also check a few moles on chest area. ). Keloid on chest for years. No treatment. It itches but doesn't hurt. A few moles on the chest. No history of mole removal.  Objective  Well appearing patient in no apparent distress; mood and affect are within normal limits.  All skin waist up examined not including face and scalp. No suspicious moles noted on back.  Objective  Chest - Medial [facility]): Firm pink dermal papules/plaques c/w keloid.   Objective  Left Breast, Left Upper Back, Right Breast, Right Upper Back: Tan-brown symmetric macules and papules.   Assessment & Plan  Keloid Chest - Medial (Center)  Intralesional injection - Chest - Medial [facility]) Location: mid chest  Informed Consent: Discussed risks (infection, pain, bleeding, bruising, thinning of the skin, loss of skin pigment,  Indentation, lack of resolution, and recurrence of lesion) and benefits of the procedure, as well as the alternatives. Informed consent was obtained. Preparation: The area was prepared in a standard fashion.   Procedure Details: An intralesional injection was performed with Kenalog 10 mg/cc. 0.2 cc in total were injected.  Total number of injections: 3  Plan: The patient was instructed on post-op care. Recommend OTC analgesia as needed for pain.   triamcinolone acetonide (KENALOG) 10 MG/ML injection 10 mg - Chest - Medial (Center)  Nevus (4) Left Breast; Right Breast; Left Upper Back; Right Upper Back

## 2019-10-20 ENCOUNTER — Other Ambulatory Visit: Payer: Self-pay

## 2019-10-20 ENCOUNTER — Encounter: Payer: Self-pay | Admitting: Physician Assistant

## 2019-10-20 ENCOUNTER — Ambulatory Visit (INDEPENDENT_AMBULATORY_CARE_PROVIDER_SITE_OTHER): Payer: No Typology Code available for payment source | Admitting: Physician Assistant

## 2019-10-20 DIAGNOSIS — L910 Hypertrophic scar: Secondary | ICD-10-CM | POA: Diagnosis not present

## 2019-10-20 MED ORDER — TRIAMCINOLONE ACETONIDE 10 MG/ML IJ SUSP
20.0000 mg | Freq: Once | INTRAMUSCULAR | Status: AC
  Administered 2019-10-20: 20 mg

## 2019-10-20 NOTE — Progress Notes (Signed)
   Follow up Visit  Subjective  Spencer Koch is a 24 y.o. male who presents for the following: Keloid (present many years, injection last month). Did not notice much improvement. It is less itchy.  Objective  Well appearing patient in no apparent distress; mood and affect are within normal limits.  A focused examination was performed including chest. Relevant physical exam findings are noted in the Assessment and Plan.   Objective  Chest - Medial [facility]): Firm pink dermal papules/plaques c/w keloid.   Assessment & Plan  Keloid Chest - Medial (Center)  Intralesional injection - Chest - Medial [facility]) Location: mid chest.  Informed Consent: Discussed risks (infection, pain, bleeding, bruising, thinning of the skin, loss of skin pigment,  Indentation, lack of resolution, and recurrence of lesion) and benefits of the procedure, as well as the alternatives. Informed consent was obtained. Preparation: The area was prepared in a standard fashion.   Procedure Details: An intralesional injection was performed with Kenalog 20 mg/cc. 0.1 cc in total were injected.  Total number of injections: 3  Plan: The patient was instructed on post-op care. Recommend OTC analgesia as needed for pain.   triamcinolone acetonide (KENALOG) 10 MG/ML injection 20 mg - Chest - Medial [facility])

## 2019-12-31 ENCOUNTER — Ambulatory Visit: Payer: No Typology Code available for payment source | Admitting: Physician Assistant

## 2020-01-01 ENCOUNTER — Ambulatory Visit: Payer: No Typology Code available for payment source | Admitting: Dermatology

## 2020-02-10 ENCOUNTER — Other Ambulatory Visit: Payer: Self-pay | Admitting: Family

## 2020-02-10 ENCOUNTER — Telehealth: Payer: Self-pay | Admitting: Family Medicine

## 2020-02-10 MED ORDER — FAMCICLOVIR 500 MG PO TABS: 500.0000 mg | ORAL_TABLET | Freq: Three times a day (TID) | ORAL | 3 refills | Status: DC

## 2020-02-10 MED FILL — FAMCICLOVIR 500 MG TABLET: 500 | 10 days supply | Qty: 30 | Fill #0

## 2020-02-10 NOTE — Telephone Encounter (Signed)
Patient aware of message below.

## 2020-02-10 NOTE — Telephone Encounter (Signed)
Please advise message below refill request for Famciclovir 250mg   Last OV 07/01/19

## 2020-02-10 NOTE — Telephone Encounter (Signed)
Caller Name: [place] Call back phone #: [phone]  MEDICATION(S): famciclovir (FAMVIR) 250 MG tablet Pt takes 3/day - pt requesting refills this time. He was advised he may need appt.  Pt said pharmacy told him acyclovir is less expensive but he isn't sure if that is a good alternative for him.  Preferred Pharmacy:  [place], [place] - [address]. Phone:  [phone]  Fax:  [phone]

## 2020-04-07 MED FILL — FAMCICLOVIR 500 MG TABLET: 500 | 10 days supply | Qty: 30 | Fill #1

## 2020-05-18 MED FILL — FAMCICLOVIR 500 MG TABLET: 500 | 10 days supply | Qty: 30 | Fill #2

## 2020-11-30 ENCOUNTER — Other Ambulatory Visit (HOSPITAL_COMMUNITY): Payer: Self-pay

## 2020-11-30 MED FILL — Famciclovir Tab 500 MG: ORAL | 30 days supply | Qty: 30 | Fill #0 | Status: AC

## 2021-09-01 ENCOUNTER — Ambulatory Visit: Payer: No Typology Code available for payment source | Admitting: Family Medicine

## 2022-05-26 ENCOUNTER — Telehealth: Payer: Self-pay | Admitting: Family Medicine

## 2022-05-26 NOTE — Telephone Encounter (Signed)
Prescription Request  05/26/2022  LOV: Visit date not found  What is the name of the medication or equipment? famciclovir (FAMVIR) 250 MG tablet   Have you contacted your pharmacy to request a refill? No   Which pharmacy would you like this sent to?   [facility], [place] [phone] and the # is [phone]  Patient notified that their request is being sent to the clinical staff for review and that they should receive a response within 2 business days.   Please advise at Mobile [phone] (mobile)   The pt also would like for dr Ethelene Hal to call him

## 2022-05-30 NOTE — Telephone Encounter (Signed)
Called patient to inform that Dr. Ethelene Hal will not be able to give him a call and advise that patient can go to urgent care for prescription or schedule an appointment to see Dr. Ethelene Hal.

## 2022-05-30 NOTE — Telephone Encounter (Signed)
Patient requesting prescription for Famciclovir be sent to pharmacy. Called patient to schedule an appointment do to no office visits since April 2021 patient asking if Dr. Ethelene Hal could give him a call patient not wanting to schedule an appointment would like to speak with Dr. Ethelene Hal. Please advise.

## 2022-08-01 ENCOUNTER — Ambulatory Visit
Admission: EM | Admit: 2022-08-01 | Discharge: 2022-08-01 | Disposition: A | Discharge status: Home / Self Care | Payer: 59 | Attending: Urgent Care | Admitting: Urgent Care

## 2022-08-01 DIAGNOSIS — A6001 Herpesviral infection of penis: Secondary | ICD-10-CM | POA: Diagnosis not present

## 2022-08-01 HISTORY — DX: Herpesviral infection, unspecified: B00.9

## 2022-08-01 MED ORDER — VALACYCLOVIR HCL 1 G PO TABS: ORAL_TABLET | ORAL | 1 refills | Status: DC

## 2022-08-01 NOTE — ED Provider Notes (Signed)
  [facility]  Note:  This document was prepared using Conservation officer, historic buildings and may include unintentional dictation errors.  MRN: [id number] DOB: 1996/01/20  Subjective:   Spencer Koch is a 27 y.o. male presenting for medication refill for an HSV outbreak. Usually uses valacyclovir. Has a current outbreak, usually happens with stress. Does not have a PCP. Plan is to re-establish with the previous practice, has an appointment in 2 weeks.   No current facility-administered medications for this encounter. No current outpatient medications on file.   No Known Allergies  Past Medical History:  Diagnosis Date   HSV infection    per pt     History reviewed. No pertinent surgical history.  No family history on file.  Social History   Tobacco Use   Smoking status: Former    Types: Cigarettes   Smokeless tobacco: Never  Vaping Use   Vaping Use: Never used  Substance Use Topics   Alcohol use: Not Currently   Drug use: Not Currently    ROS   Objective:   Vitals: BP 105/61 (BP Location: Left Arm)   Pulse (!) 54   Temp 98 F (36.7 C) (Oral)   Resp 20   SpO2 98%   Physical Exam Constitutional:      General: He is not in acute distress.    Appearance: Normal appearance. He is well-developed and normal weight. He is not ill-appearing, toxic-appearing or diaphoretic.  HENT:     Head: Normocephalic and atraumatic.     Right Ear: External ear normal.     Left Ear: External ear normal.     Nose: Nose normal.     Mouth/Throat:     Pharynx: Oropharynx is clear.  Eyes:     General: No scleral icterus.       Right eye: No discharge.        Left eye: No discharge.     Extraocular Movements: Extraocular movements intact.  Cardiovascular:     Rate and Rhythm: Normal rate.  Pulmonary:     Effort: Pulmonary effort is normal.  Musculoskeletal:     Cervical back: Normal range of motion.  Neurological:     Mental Status: He is alert  and oriented to person, place, and time.  Psychiatric:        Mood and Affect: Mood normal.        Behavior: Behavior normal.        Thought Content: Thought content normal.        Judgment: Judgment normal.     Assessment and Plan :   PDMP not reviewed this encounter.  1. Herpes simplex infection of penis    Start valacyclovir for genital HSV. Provided with refills for future outbreaks. Follow up with PCP otherwise. Counseled patient on potential for adverse effects with medications prescribed/recommended today, ER and return-to-clinic precautions discussed, patient verbalized understanding.    Wallis Bamberg, [place] 08/01/22 [phone]

## 2022-08-01 NOTE — ED Triage Notes (Signed)
Pt requesting med refill for "HSV outbreak"-NAD-steady gait

## 2022-08-15 NOTE — Progress Notes (Unsigned)
  [facility] [address] [place] [address] Dept: [phone] Dept Fax: [phone]  New Patient Office Visit  Subjective:   Spencer Koch [birth year] 08/16/2022  No chief complaint on file.   HPI: Spencer B Tarpley presents today to establish care at [facility]. Introduced to Publishing rights manager role and practice setting.  All questions answered.   Last PCP: *** Last annual physical: unknown Concerns: see below   The following portions of the patient's history were reviewed and updated as appropriate: past medical history, past surgical history, family history, social history, allergies, medications, and problem list.   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 No family history on file. Outpatient Medications Prior to Visit  Medication Sig Dispense Refill   valACYclovir (VALTREX) 1000 MG tablet At the start of an outbreak take 1 tablet daily for 5 days. 30 tablet 1   No facility-administered medications prior to visit.   No Known Allergies  ROS: A complete ROS was performed with pertinent positives/negatives noted in the HPI. The remainder of the ROS are negative.   Objective:   There were no vitals filed for this visit.  GENERAL: Well-appearing, in NAD. Well nourished.  SKIN: Pink, warm and dry. No rash, lesion, ulceration, or ecchymoses.  NECK: Trachea midline. Full ROM w/o pain or tenderness. No lymphadenopathy.  RESPIRATORY: Chest wall symmetrical. Respirations even and non-labored. Breath sounds clear to auscultation bilaterally.  CARDIAC: S1, S2 present, regular rate and rhythm. Peripheral  pulses 2+ bilaterally.  MSK: Muscle tone and strength appropriate for age. Joints w/o tenderness, redness, or swelling.  EXTREMITIES: Without clubbing, cyanosis, or edema.  NEUROLOGIC: No motor or sensory deficits. Steady, even gait.  PSYCH/MENTAL STATUS: Alert, oriented x 3. Cooperative, appropriate mood and affect.   Health Maintenance Due  Topic Date Due   COVID-19 Vaccine (1) Never done   Hepatitis C Screening  Never done   DTaP/Tdap/Td (1 - Tdap) Never done    No results found for any visits on 08/16/22.     Assessment & Plan:  There are no diagnoses linked to this encounter.   No follow-ups on file.   Salvatore Decent, FNP

## 2022-08-16 ENCOUNTER — Encounter: Payer: Self-pay | Admitting: Internal Medicine

## 2022-08-16 ENCOUNTER — Ambulatory Visit: Payer: 59 | Admitting: Internal Medicine

## 2022-08-16 VITALS — BP 102/70 | HR 62 | Temp 98.3°F | Ht 66.0 in | Wt 181.4 lb

## 2022-08-16 DIAGNOSIS — Z7689 Persons encountering health services in other specified circumstances: Secondary | ICD-10-CM

## 2022-08-16 DIAGNOSIS — A6001 Herpesviral infection of penis: Secondary | ICD-10-CM | POA: Diagnosis not present

## 2022-08-16 MED ORDER — VALACYCLOVIR HCL 500 MG PO TABS
500.0000 mg | ORAL_TABLET | Freq: Every day | ORAL | 3 refills | Status: DC
Start: 2022-08-16 — End: 2023-10-29

## 2023-01-10 ENCOUNTER — Telehealth: Payer: Self-pay | Admitting: Internal Medicine

## 2023-01-10 NOTE — Telephone Encounter (Signed)
Lvm for pt to call us back to sch for 1 year physical around 08/16/2023

## 2023-08-25 ENCOUNTER — Other Ambulatory Visit: Payer: Self-pay | Admitting: Internal Medicine

## 2023-08-25 DIAGNOSIS — A6001 Herpesviral infection of penis: Secondary | ICD-10-CM

## 2023-10-29 ENCOUNTER — Ambulatory Visit
Admission: EM | Admit: 2023-10-29 | Discharge: 2023-10-29 | Disposition: A | Discharge status: Home / Self Care | Payer: Self-pay | Attending: Family Medicine | Admitting: Family Medicine

## 2023-10-29 ENCOUNTER — Other Ambulatory Visit: Payer: Self-pay

## 2023-10-29 DIAGNOSIS — A6001 Herpesviral infection of penis: Secondary | ICD-10-CM

## 2023-10-29 MED ORDER — VALACYCLOVIR HCL 500 MG PO TABS: 500.0000 mg | ORAL_TABLET | Freq: Every day | ORAL | 0 refills | Status: DC

## 2023-10-29 NOTE — ED Provider Notes (Signed)
 [facility]    CSN: [id number] Arrival date & time: 10/29/23  1251      History   Chief Complaint No chief complaint on file.   HPI Spencer Koch is a 28 y.o. male presents for medication refill.  Patient has a history of HSV one of the penis.  Reports has been taking 500 mg of Valtrex  daily for suppression.  He has been out for some time and noticed some symptoms beginning yesterday.  He does have a PCP but they would not refill that until he makes an appointment.  He denies dysuria, fevers.  No other concerns at this time.  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 No pertinent surgical history.     Home Medications    Prior to Admission medications   Medication Sig Start Date End Date Taking? Authorizing Provider  valACYclovir  (VALTREX ) 500 MG tablet Take 1 tablet (500 mg total) by mouth daily. Take daily for suppression.  At onset of symptoms take 2 daily for 5 days. 10/29/23  Yes Loreda Myla SAUNDERS, NP    Family History History reviewed. No pertinent family history.  Social History Social History   Tobacco Use   Smoking status: Former    Types: Cigarettes   Smokeless tobacco: Never  Vaping Use   Vaping status: Never Used  Substance Use Topics   Alcohol use: Not Currently   Drug use: Not Currently     Allergies   Patient has no known allergies.   Review of Systems Review of Systems  Genitourinary:        HSV 1 infection     Physical Exam Triage Vital Signs ED Triage Vitals  Encounter Vitals Group     BP 10/29/23 1302 115/72     Girls Systolic BP Percentile --      Girls Diastolic BP Percentile --      Boys Systolic BP Percentile --      Boys Diastolic BP  Percentile --      Pulse Rate 10/29/23 1302 (!) 57     Resp 10/29/23 1302 16     Temp 10/29/23 1302 97.7 F (36.5 C)     Temp Source 10/29/23 1302 Oral     SpO2 10/29/23 1302 98 %     Weight --      Height --      Head Circumference --      Peak Flow --      Pain Score 10/29/23 1301 0     Pain Loc --      Pain Education --      Exclude from Growth Chart --    No data found.  Updated Vital Signs BP 115/72   Pulse (!) 57   Temp 97.7 F (36.5 C) (Oral)   Resp 16   SpO2 98%   Visual Acuity Right Eye Distance:   Left Eye Distance:   Bilateral Distance:    Right Eye Near:   Left Eye Near:    Bilateral Near:     Physical Exam Vitals and nursing note reviewed.  Constitutional:      General: He is not in acute distress.    Appearance:  Normal appearance. He is not ill-appearing.  HENT:     Head: Normocephalic and atraumatic.  Eyes:     Pupils: Pupils are equal, round, and reactive to light.  Cardiovascular:     Rate and Rhythm: Normal rate.  Pulmonary:     Effort: Pulmonary effort is normal.  Genitourinary:    Comments: Deferred Skin:    General: Skin is warm and dry.  Neurological:     General: No focal deficit present.     Mental Status: He is alert and oriented to person, place, and time.  Psychiatric:        Mood and Affect: Mood normal.        Behavior: Behavior normal.      UC Treatments / Results  Labs (all labs ordered are listed, but only abnormal results are displayed) Labs Reviewed - No data to display  EKG   Radiology No results found.  Procedures Procedures (including critical care time)  Medications Ordered in UC Medications - No data to display  Initial Impression / Assessment and Plan / UC Course  I have reviewed the triage vital signs and the nursing notes.  Pertinent labs & imaging results that were available during my care of the patient were reviewed by me and considered in my medical decision making (see chart for  details).     Discussed with patient able to supply 30-day refill.  Any additional refills will need to go through his PCP and he should contact them ASAP to make a follow-up appointment.  As he states he is having symptoms he can take 2 tablets daily for 5 days for current outbreak and then resume 500 mg daily.  Patient verbalized understanding of these instructions.  ER precautions reviewed. Final Clinical Impressions(s) / UC Diagnoses   Final diagnoses:  Herpes simplex infection of penis     Discharge Instructions      I refilled your Valtrex  for 30 days.  Any additional refills will need to go through your primary care provider, please contact them soon as possible to make a follow-up appointment.  Please go to the ER if you have any worsening symptoms.  Hope you feel better soon!    ED Prescriptions     Medication Sig Dispense Auth. Provider   valACYclovir  (VALTREX ) 500 MG tablet Take 1 tablet (500 mg total) by mouth daily. Take daily for suppression.  At onset of symptoms take 2 daily for 5 days. 30 tablet Brannon Levene, Jodi R, NP      PDMP not reviewed this encounter.   Loreda Myla SAUNDERS, NP 10/29/23 1320

## 2023-10-29 NOTE — Discharge Instructions (Addendum)
 I refilled your Valtrex  for 30 days.  Any additional refills will need to go through your primary care provider, please contact them soon as possible to make a follow-up appointment.  Please go to the ER if you have any worsening symptoms.  Hope you feel better soon!

## 2023-10-29 NOTE — ED Triage Notes (Signed)
 Pt wants a refill on valtrex  prescription

## 2023-11-26 ENCOUNTER — Other Ambulatory Visit: Payer: Self-pay | Admitting: Internal Medicine

## 2023-11-26 DIAGNOSIS — A6001 Herpesviral infection of penis: Secondary | ICD-10-CM

## 2023-11-26 NOTE — Telephone Encounter (Signed)
 Copied from CRM [id number]. Topic: Clinical - Medication Refill >> Nov 26, 2023  5:03 PM Leah C wrote: Medication: valACYclovir  (VALTREX ) 500 MG tablet  Has the patient contacted their pharmacy? Yes   This is the patient's preferred pharmacy:  [id number] - [place], [place] - [address] [place]  [address] Phone: [phone] Fax: [phone]  Is this the correct pharmacy for this prescription? Yes  If no, delete pharmacy and type the correct one.   Has the prescription been filled recently? Yes  Is the patient out of the medication? Yes  Has the patient been seen for an appointment in the last year OR does the patient have an upcoming appointment? Yes  Can we respond through MyChart? Yes  Agent: Please be advised that Rx refills may take up to 3 business days. We ask that you follow-up with your pharmacy.

## 2023-11-27 MED ORDER — VALACYCLOVIR HCL 500 MG PO TABS: 500.0000 mg | ORAL_TABLET | Freq: Every day | ORAL | 0 refills | Status: DC

## 2023-12-31 ENCOUNTER — Other Ambulatory Visit: Payer: Self-pay | Admitting: Internal Medicine

## 2023-12-31 DIAGNOSIS — A6001 Herpesviral infection of penis: Secondary | ICD-10-CM

## 2023-12-31 NOTE — Telephone Encounter (Unsigned)
 Copied from CRM #[id number]. Topic: Clinical - Medication Refill >> Dec 31, 2023  3:48 PM [place] M wrote: Medication: valACYclovir  (VALTREX ) 500 MG tablet  Has the patient contacted their pharmacy? Yes (Agent: If no, request that the patient contact the pharmacy for the refill. If patient does not wish to contact the pharmacy document the reason why and proceed with request.) (Agent: If yes, when and what did the pharmacy advise?)  This is the patient's preferred pharmacy:  [id number] - [place], [place] - [address] [place] [address] Phone: [phone] Fax: [phone]  Is this the correct pharmacy for this prescription? Yes If no, delete pharmacy and type the correct one.   Has the prescription been filled recently? Yes  Is the patient out of the medication? Yes  Has the patient been seen for an appointment in the last year OR does the patient have an upcoming appointment? Yes  Can we respond through MyChart? Yes  Agent: Please be advised that Rx refills may take up to 3 business days. We ask that you follow-up with your pharmacy.

## 2024-01-01 MED ORDER — VALACYCLOVIR HCL 500 MG PO TABS: 500.0000 mg | ORAL_TABLET | Freq: Every day | ORAL | 0 refills | Status: DC

## 2024-02-04 ENCOUNTER — Other Ambulatory Visit: Payer: Self-pay | Admitting: Internal Medicine

## 2024-02-04 DIAGNOSIS — A6001 Herpesviral infection of penis: Secondary | ICD-10-CM

## 2024-02-04 NOTE — Telephone Encounter (Signed)
 Copied from CRM [id number]. Topic: Clinical - Medication Refill >> Feb 04, 2024 11:27 AM Aleatha C wrote: Medication: valACYclovir  (VALTREX ) 500 MG tablet Wants automatic refill every month  Has the patient contacted their pharmacy? No (Agent: If no, request that the patient contact the pharmacy for the refill. If patient does not wish to contact the pharmacy document the reason why and proceed with request.) (Agent: If yes, when and what did the pharmacy advise?)  This is the patient's preferred pharmacy:  [facility] [address] [place], [place] ZIP [phone] Phone: [phone] Fax: [phone]    Is this the correct pharmacy for this prescription? Yes If no, delete pharmacy and type the correct one.   Has the prescription been filled recently? No  Is the patient out of the medication? Yes  Has the patient been seen for an appointment in the last year OR does the patient have an upcoming appointment? Yes  Can we respond through MyChart? No  Agent: Please be advised that Rx refills may take up to 3 business days. We ask that you follow-up with your pharmacy.

## 2024-02-05 ENCOUNTER — Telehealth: Payer: Self-pay | Admitting: Internal Medicine

## 2024-02-05 NOTE — Telephone Encounter (Signed)
 Copied from CRM #[id number]. Topic: Clinical - Medication Question >> Feb 05, 2024  3:54 PM Robinson H wrote: Reason for CRM: Patient needed to schedule an appointment to refill his valACYclovir  (VALTREX ) 500 MG tablet patient scheduled an appointment for 12/3 and wants something to hold him over until appointment  [facility] [phone]

## 2024-02-11 ENCOUNTER — Telehealth: Payer: Self-pay

## 2024-02-11 NOTE — Telephone Encounter (Signed)
 Pt has been rescheduled 02/19/24 at 10:20 am.

## 2024-02-11 NOTE — Telephone Encounter (Signed)
 Pt has to be rescheduled due to provider being out of office. Next available appointment isn't until mid or late January. I have to call patient back to schedule because I need to override visit type.  Last ov 08/16/2022  Pt is requesting a refill for Valacyclovir  500 mg tablet.  Pharmacy is [facility].

## 2024-02-13 ENCOUNTER — Encounter: Payer: Self-pay | Admitting: Internal Medicine

## 2024-02-18 ENCOUNTER — Encounter: Payer: Self-pay | Admitting: Family Medicine

## 2024-02-18 ENCOUNTER — Ambulatory Visit: Payer: Self-pay | Admitting: Family Medicine

## 2024-02-18 VITALS — BP 110/76 | HR 51 | Ht 66.0 in | Wt 187.4 lb

## 2024-02-18 DIAGNOSIS — B009 Herpesviral infection, unspecified: Secondary | ICD-10-CM

## 2024-02-18 MED ORDER — VALACYCLOVIR HCL 500 MG PO TABS: 500.0000 mg | ORAL_TABLET | Freq: Every day | ORAL | 3 refills | Status: AC

## 2024-02-18 NOTE — Progress Notes (Signed)
   Assessment & Plan:   1. Herpes simplex infection (Primary), stable, no outbreaks - valACYclovir  (VALTREX ) 500 MG tablet; Take 1 tablet (500 mg total) by mouth daily. Take daily for suppression.  At onset of symptoms take 2 daily for 5 days.  Dispense: 90 tablet; Refill: 3  Plan: Refill Valtrex  500 mg daily for suppressive therapy, no current outbreaks. Continue safe sexual practices (monogamous, one partner). Education: Virus is managed with daily therapy, no cure. Asymptomatic shedding can still occur. Follow-up: 1 year or sooner if concerns arise.  Geni Shutter, DO, MS, FAAFP, Dipl. [facility] [address] [place], [address] Dept: [phone] Dept Fax: [phone]  Subjective:   Review of Systems: Negative, with the exception of above mentioned in HPI.  Current Outpatient Medications:    valACYclovir  (VALTREX ) 500 MG tablet, Take 1 tablet (500 mg total) by mouth daily. Take daily for suppression.  At onset of symptoms take 2 daily for 5 days., Disp: 90 tablet, Rfl: 3   Objective:   BP 110/76 (BP Location: Right Arm, Cuff Size: Large)   Pulse (!) 51   Ht 5' 6 (1.676 m)   Wt 187 lb 6.4 oz (85 kg)   SpO2 98%   BMI 30.25 kg/m   Wt Readings from Last 3 Encounters:  02/18/24 187 lb 6.4 oz (85 kg)  08/16/22 181 lb 6.4 oz (82.3 kg)  07/01/19 189 lb 6.4 oz (85.9 kg)   Physical Exam Constitutional:      General: He is not in acute distress.    Appearance: He is well-developed.  HENT:     Head: Normocephalic and atraumatic.  Eyes:     Conjunctiva/sclera: Conjunctivae normal.  Cardiovascular:     Rate and Rhythm: Normal rate and regular rhythm.  Pulmonary:     Effort: Pulmonary effort is normal.  Musculoskeletal:     Cervical back: Normal range of motion.  Neurological:     Mental Status: He is alert.  Psychiatric:        Behavior: Behavior normal.

## 2024-02-19 ENCOUNTER — Encounter: Payer: Self-pay | Admitting: Internal Medicine
